# Patient Record
Sex: Female | Born: 1995 | Race: Asian | Hispanic: No | Marital: Married | Smoking: Never smoker
Health system: Southern US, Community
[De-identification: ages and names within clinical notes are randomized; demographics above are authoritative.]

## PROBLEM LIST (undated history)

## (undated) ENCOUNTER — Ambulatory Visit

## (undated) ENCOUNTER — Ambulatory Visit (HOSPITAL_COMMUNITY): Admission: EM | Payer: Self-pay | Source: Home / Self Care

## (undated) DIAGNOSIS — Z789 Other specified health status: Secondary | ICD-10-CM

## (undated) HISTORY — PX: NO PAST SURGERIES: SHX2092

---

## 2016-12-07 NOTE — L&D Delivery Note (Signed)
Delivery Note Patient pushed for 30 minutes.  At 8:43 PM a viable female was delivered via  (Presentation: OA ). There was a tight nuchal cord x 3 that was unable to be reduced prior to delivery.  APGAR: 8,9; weight pending .   Placenta status:spontaneous , in tact .  Cord: 3V with the following complications: None.  Cord pH: n/a  Anesthesia:  Epidural Episiotomy:  None Lacerations:  Hemostatic left labial laceration Suture Repair: n/a Est. Blood Loss (mL):  100 mL  Mom to postpartum.  Baby to Couplet care / Skin to Skin.  Kelsey Hale Rumaldo Difatta 12/02/2017, 9:02 PM

## 2017-06-28 LAB — OB RESULTS CONSOLE RUBELLA ANTIBODY, IGM: RUBELLA: IMMUNE

## 2017-06-28 LAB — OB RESULTS CONSOLE GC/CHLAMYDIA
Chlamydia: NEGATIVE
Gonorrhea: NEGATIVE

## 2017-06-28 LAB — OB RESULTS CONSOLE RPR: RPR: NONREACTIVE

## 2017-06-28 LAB — OB RESULTS CONSOLE HIV ANTIBODY (ROUTINE TESTING): HIV: NONREACTIVE

## 2017-06-28 LAB — OB RESULTS CONSOLE HEPATITIS B SURFACE ANTIGEN: Hepatitis B Surface Ag: NEGATIVE

## 2017-06-30 DIAGNOSIS — Z3490 Encounter for supervision of normal pregnancy, unspecified, unspecified trimester: Secondary | ICD-10-CM

## 2017-08-13 ENCOUNTER — Other Ambulatory Visit (HOSPITAL_COMMUNITY): Payer: Self-pay | Admitting: Obstetrics

## 2017-08-13 DIAGNOSIS — Z3689 Encounter for other specified antenatal screening: Secondary | ICD-10-CM

## 2017-08-20 ENCOUNTER — Encounter (HOSPITAL_COMMUNITY): Payer: Self-pay | Admitting: *Deleted

## 2017-08-24 ENCOUNTER — Other Ambulatory Visit (HOSPITAL_COMMUNITY): Payer: Self-pay | Admitting: Obstetrics

## 2017-08-24 ENCOUNTER — Encounter (HOSPITAL_COMMUNITY): Payer: Self-pay

## 2017-08-24 ENCOUNTER — Ambulatory Visit (HOSPITAL_COMMUNITY)
Admission: RE | Admit: 2017-08-24 | Discharge: 2017-08-24 | Disposition: A | Discharge status: Home / Self Care | Payer: Medicaid Other | Source: Ambulatory Visit | Attending: Obstetrics | Admitting: Obstetrics

## 2017-08-24 ENCOUNTER — Ambulatory Visit (HOSPITAL_COMMUNITY): Admission: RE | Admit: 2017-08-24 | Payer: Medicaid Other | Source: Ambulatory Visit

## 2017-08-24 ENCOUNTER — Other Ambulatory Visit (HOSPITAL_COMMUNITY): Payer: Self-pay | Admitting: *Deleted

## 2017-08-24 DIAGNOSIS — O289 Unspecified abnormal findings on antenatal screening of mother: Secondary | ICD-10-CM | POA: Insufficient documentation

## 2017-08-24 DIAGNOSIS — Z3A23 23 weeks gestation of pregnancy: Secondary | ICD-10-CM

## 2017-08-24 DIAGNOSIS — Z3689 Encounter for other specified antenatal screening: Secondary | ICD-10-CM | POA: Diagnosis present

## 2017-08-24 DIAGNOSIS — O365920 Maternal care for other known or suspected poor fetal growth, second trimester, not applicable or unspecified: Secondary | ICD-10-CM | POA: Diagnosis not present

## 2017-08-24 DIAGNOSIS — O359XX0 Maternal care for (suspected) fetal abnormality and damage, unspecified, not applicable or unspecified: Secondary | ICD-10-CM

## 2017-08-24 DIAGNOSIS — O365990 Maternal care for other known or suspected poor fetal growth, unspecified trimester, not applicable or unspecified: Secondary | ICD-10-CM

## 2017-08-24 HISTORY — DX: Other specified health status: Z78.9

## 2017-08-27 ENCOUNTER — Other Ambulatory Visit (HOSPITAL_COMMUNITY): Payer: Self-pay

## 2017-08-27 ENCOUNTER — Encounter (HOSPITAL_COMMUNITY): Payer: Self-pay

## 2017-09-14 ENCOUNTER — Other Ambulatory Visit (HOSPITAL_COMMUNITY): Payer: Self-pay | Admitting: Obstetrics and Gynecology

## 2017-09-14 ENCOUNTER — Ambulatory Visit (HOSPITAL_COMMUNITY)
Admission: RE | Admit: 2017-09-14 | Discharge: 2017-09-14 | Disposition: A | Discharge status: Home / Self Care | Payer: Medicaid Other | Source: Ambulatory Visit | Attending: Obstetrics | Admitting: Obstetrics

## 2017-09-14 ENCOUNTER — Encounter (HOSPITAL_COMMUNITY): Payer: Self-pay

## 2017-09-14 DIAGNOSIS — O365920 Maternal care for other known or suspected poor fetal growth, second trimester, not applicable or unspecified: Secondary | ICD-10-CM | POA: Insufficient documentation

## 2017-09-14 DIAGNOSIS — Z362 Encounter for other antenatal screening follow-up: Secondary | ICD-10-CM | POA: Insufficient documentation

## 2017-09-14 DIAGNOSIS — Z3A26 26 weeks gestation of pregnancy: Secondary | ICD-10-CM | POA: Insufficient documentation

## 2017-09-14 DIAGNOSIS — O365990 Maternal care for other known or suspected poor fetal growth, unspecified trimester, not applicable or unspecified: Secondary | ICD-10-CM

## 2017-09-15 ENCOUNTER — Other Ambulatory Visit (HOSPITAL_COMMUNITY): Payer: Self-pay | Admitting: *Deleted

## 2017-09-15 DIAGNOSIS — O365990 Maternal care for other known or suspected poor fetal growth, unspecified trimester, not applicable or unspecified: Secondary | ICD-10-CM

## 2017-09-17 ENCOUNTER — Other Ambulatory Visit: Payer: Self-pay

## 2017-09-21 ENCOUNTER — Ambulatory Visit (HOSPITAL_COMMUNITY)
Admission: RE | Admit: 2017-09-21 | Discharge: 2017-09-21 | Disposition: A | Discharge status: Home / Self Care | Payer: Medicaid Other | Source: Ambulatory Visit | Attending: Obstetrics | Admitting: Obstetrics

## 2017-09-21 ENCOUNTER — Other Ambulatory Visit (HOSPITAL_COMMUNITY): Payer: Self-pay | Admitting: Maternal and Fetal Medicine

## 2017-09-21 ENCOUNTER — Encounter (HOSPITAL_COMMUNITY): Payer: Self-pay

## 2017-09-21 DIAGNOSIS — O365990 Maternal care for other known or suspected poor fetal growth, unspecified trimester, not applicable or unspecified: Secondary | ICD-10-CM

## 2017-09-21 DIAGNOSIS — Z3A27 27 weeks gestation of pregnancy: Secondary | ICD-10-CM

## 2017-09-21 DIAGNOSIS — O365920 Maternal care for other known or suspected poor fetal growth, second trimester, not applicable or unspecified: Secondary | ICD-10-CM | POA: Diagnosis not present

## 2017-09-28 ENCOUNTER — Ambulatory Visit (HOSPITAL_COMMUNITY)
Admission: RE | Admit: 2017-09-28 | Discharge: 2017-09-28 | Disposition: A | Discharge status: Home / Self Care | Payer: Medicaid Other | Source: Ambulatory Visit | Attending: Obstetrics | Admitting: Obstetrics

## 2017-09-28 ENCOUNTER — Encounter (HOSPITAL_COMMUNITY): Payer: Self-pay

## 2017-09-28 DIAGNOSIS — O365920 Maternal care for other known or suspected poor fetal growth, second trimester, not applicable or unspecified: Secondary | ICD-10-CM | POA: Insufficient documentation

## 2017-09-28 DIAGNOSIS — O365990 Maternal care for other known or suspected poor fetal growth, unspecified trimester, not applicable or unspecified: Secondary | ICD-10-CM

## 2017-09-28 DIAGNOSIS — Z3A28 28 weeks gestation of pregnancy: Secondary | ICD-10-CM | POA: Diagnosis not present

## 2017-09-28 NOTE — Procedures (Signed)
Kelsey Hale Mar 25, 1996 [id number]  Fetus A Non-Stress Test Interpretation for 09/28/17  Indication: Unsatisfactory BPP  Fetal Heart Rate A Mode: External Baseline Rate (A): 135 bpm Variability: Moderate Accelerations: 10 x 10 Decelerations: Variable Multiple birth?: No  Uterine Activity Mode: Palpation, Toco Contraction Frequency (min): x1 Contraction Duration (sec): 50 Contraction Quality: Mild (Pt denies feeling ) Resting Tone Palpated: Relaxed Resting Time: Adequate  Interpretation (Fetal Testing) Nonstress Test Interpretation: Reactive Overall Impression: Reassuring for gestational age Comments: Reviewed tracing with Dr. Otho PerlNitsche

## 2017-10-05 ENCOUNTER — Ambulatory Visit (HOSPITAL_COMMUNITY): Admission: RE | Admit: 2017-10-05 | Payer: Medicaid Other | Source: Ambulatory Visit

## 2017-10-13 ENCOUNTER — Encounter (HOSPITAL_COMMUNITY): Payer: Self-pay

## 2017-10-13 ENCOUNTER — Ambulatory Visit (HOSPITAL_COMMUNITY)
Admission: RE | Admit: 2017-10-13 | Discharge: 2017-10-13 | Disposition: A | Discharge status: Home / Self Care | Payer: Medicaid Other | Source: Ambulatory Visit | Attending: Maternal and Fetal Medicine | Admitting: Maternal and Fetal Medicine

## 2017-10-13 ENCOUNTER — Other Ambulatory Visit (HOSPITAL_COMMUNITY): Payer: Self-pay | Admitting: Maternal and Fetal Medicine

## 2017-10-13 DIAGNOSIS — Z3A30 30 weeks gestation of pregnancy: Secondary | ICD-10-CM

## 2017-10-13 DIAGNOSIS — O365930 Maternal care for other known or suspected poor fetal growth, third trimester, not applicable or unspecified: Secondary | ICD-10-CM | POA: Diagnosis present

## 2017-10-13 DIAGNOSIS — O365990 Maternal care for other known or suspected poor fetal growth, unspecified trimester, not applicable or unspecified: Secondary | ICD-10-CM

## 2017-10-14 ENCOUNTER — Other Ambulatory Visit (HOSPITAL_COMMUNITY): Payer: Self-pay | Admitting: *Deleted

## 2017-10-14 DIAGNOSIS — O26643 Intrahepatic cholestasis of pregnancy, third trimester: Secondary | ICD-10-CM

## 2017-10-14 DIAGNOSIS — O26613 Liver and biliary tract disorders in pregnancy, third trimester: Principal | ICD-10-CM

## 2017-10-14 DIAGNOSIS — K831 Obstruction of bile duct: Secondary | ICD-10-CM

## 2017-11-10 ENCOUNTER — Encounter (HOSPITAL_COMMUNITY): Payer: Self-pay

## 2017-11-10 ENCOUNTER — Ambulatory Visit (HOSPITAL_COMMUNITY)
Admission: RE | Admit: 2017-11-10 | Discharge: 2017-11-10 | Disposition: A | Discharge status: Home / Self Care | Payer: Medicaid Other | Source: Ambulatory Visit | Attending: Obstetrics | Admitting: Obstetrics

## 2017-11-19 LAB — OB RESULTS CONSOLE GC/CHLAMYDIA
Chlamydia: NEGATIVE
Gonorrhea: NEGATIVE

## 2017-11-21 LAB — OB RESULTS CONSOLE GBS: GBS: NEGATIVE

## 2017-11-23 ENCOUNTER — Telehealth (HOSPITAL_COMMUNITY): Payer: Self-pay | Admitting: *Deleted

## 2017-11-24 ENCOUNTER — Inpatient Hospital Stay (HOSPITAL_COMMUNITY)
Admission: RE | Admit: 2017-11-24 | Discharge: 2017-11-24 | Disposition: A | Discharge status: Home / Self Care | Payer: Medicaid Other | Source: Ambulatory Visit | Attending: Maternal and Fetal Medicine | Admitting: Maternal and Fetal Medicine

## 2017-11-25 ENCOUNTER — Encounter (HOSPITAL_COMMUNITY): Payer: Self-pay

## 2017-11-25 ENCOUNTER — Other Ambulatory Visit (HOSPITAL_COMMUNITY): Payer: Self-pay | Admitting: Maternal and Fetal Medicine

## 2017-11-25 ENCOUNTER — Ambulatory Visit (HOSPITAL_COMMUNITY)
Admission: RE | Admit: 2017-11-25 | Discharge: 2017-11-25 | Disposition: A | Discharge status: Home / Self Care | Payer: Medicaid Other | Source: Ambulatory Visit | Attending: Obstetrics | Admitting: Obstetrics

## 2017-11-25 DIAGNOSIS — O26613 Liver and biliary tract disorders in pregnancy, third trimester: Principal | ICD-10-CM

## 2017-11-25 DIAGNOSIS — O365930 Maternal care for other known or suspected poor fetal growth, third trimester, not applicable or unspecified: Secondary | ICD-10-CM | POA: Insufficient documentation

## 2017-11-25 DIAGNOSIS — Z3A36 36 weeks gestation of pregnancy: Secondary | ICD-10-CM | POA: Insufficient documentation

## 2017-11-25 DIAGNOSIS — K8020 Calculus of gallbladder without cholecystitis without obstruction: Secondary | ICD-10-CM | POA: Diagnosis not present

## 2017-11-25 DIAGNOSIS — K831 Obstruction of bile duct: Secondary | ICD-10-CM

## 2017-12-01 ENCOUNTER — Other Ambulatory Visit: Payer: Self-pay | Admitting: Obstetrics and Gynecology

## 2017-12-02 ENCOUNTER — Inpatient Hospital Stay (HOSPITAL_COMMUNITY)
Admission: RE | Admit: 2017-12-02 | Discharge: 2017-12-04 | DRG: 805 | Disposition: A | Discharge status: Home / Self Care | Payer: Medicaid Other | Source: Ambulatory Visit | Attending: Obstetrics | Admitting: Obstetrics

## 2017-12-02 ENCOUNTER — Encounter (HOSPITAL_COMMUNITY): Payer: Self-pay

## 2017-12-02 ENCOUNTER — Inpatient Hospital Stay (HOSPITAL_COMMUNITY): Payer: Medicaid Other | Admitting: Anesthesiology

## 2017-12-02 DIAGNOSIS — O2662 Liver and biliary tract disorders in childbirth: Secondary | ICD-10-CM | POA: Diagnosis present

## 2017-12-02 DIAGNOSIS — K831 Obstruction of bile duct: Secondary | ICD-10-CM | POA: Diagnosis present

## 2017-12-02 DIAGNOSIS — Z3A37 37 weeks gestation of pregnancy: Secondary | ICD-10-CM | POA: Diagnosis not present

## 2017-12-02 DIAGNOSIS — O365930 Maternal care for other known or suspected poor fetal growth, third trimester, not applicable or unspecified: Secondary | ICD-10-CM | POA: Diagnosis present

## 2017-12-02 DIAGNOSIS — Z3490 Encounter for supervision of normal pregnancy, unspecified, unspecified trimester: Secondary | ICD-10-CM

## 2017-12-02 LAB — ABO/RH: ABO/RH(D): A POS

## 2017-12-02 LAB — CBC
HCT: 39.6 % (ref 36.0–46.0)
Hemoglobin: 13.1 g/dL (ref 12.0–15.0)
MCH: 29.5 pg (ref 26.0–34.0)
MCHC: 33.1 g/dL (ref 30.0–36.0)
MCV: 89.2 fL (ref 78.0–100.0)
PLATELETS: 185 10*3/uL (ref 150–400)
RBC: 4.44 MIL/uL (ref 3.87–5.11)
RDW: 13.2 % (ref 11.5–15.5)
WBC: 14.2 10*3/uL — AB (ref 4.0–10.5)

## 2017-12-02 LAB — RPR: RPR: NONREACTIVE

## 2017-12-02 LAB — COMPREHENSIVE METABOLIC PANEL
ALBUMIN: 2.7 g/dL — AB (ref 3.5–5.0)
ALK PHOS: 159 U/L — AB (ref 38–126)
ALT: 31 U/L (ref 14–54)
AST: 26 U/L (ref 15–41)
Anion gap: 10 (ref 5–15)
BILIRUBIN TOTAL: 0.7 mg/dL (ref 0.3–1.2)
BUN: 11 mg/dL (ref 6–20)
CALCIUM: 8.7 mg/dL — AB (ref 8.9–10.3)
CO2: 20 mmol/L — AB (ref 22–32)
CREATININE: 0.67 mg/dL (ref 0.44–1.00)
Chloride: 104 mmol/L (ref 101–111)
GFR calc Af Amer: >60 mL/min (ref >60)
GFR calc non Af Amer: >60 mL/min (ref >60)
GLUCOSE: 111 mg/dL — AB (ref 65–99)
Potassium: 3.6 mmol/L (ref 3.5–5.1)
SODIUM: 134 mmol/L — AB (ref 135–145)
TOTAL PROTEIN: 6.2 g/dL — AB (ref 6.5–8.1)

## 2017-12-02 LAB — TYPE AND SCREEN
ABO/RH(D): A POS
ANTIBODY SCREEN: NEGATIVE

## 2017-12-02 LAB — OB RESULTS CONSOLE ABO/RH: RH TYPE: POSITIVE

## 2017-12-02 MED ORDER — OXYTOCIN 40 UNITS IN LACTATED RINGERS INFUSION - SIMPLE MED
1.0000 m[IU]/min | INTRAVENOUS | Status: DC
  Administered 2017-12-02: 2 m[IU]/min via INTRAVENOUS

## 2017-12-02 MED ORDER — DIPHENHYDRAMINE HCL 50 MG/ML IJ SOLN: 12.5000 mg | INTRAMUSCULAR | Status: DC | PRN

## 2017-12-02 MED ORDER — SENNOSIDES-DOCUSATE SODIUM 8.6-50 MG PO TABS
2.0000 | ORAL_TABLET | ORAL | Status: DC
  Administered 2017-12-03 (×2): 2 via ORAL
  Filled 2017-12-02 (×2): qty 2

## 2017-12-02 MED ORDER — ACETAMINOPHEN 325 MG PO TABS: 650.0000 mg | ORAL_TABLET | ORAL | Status: DC | PRN

## 2017-12-02 MED ORDER — EPHEDRINE 5 MG/ML INJ
10.0000 mg | INTRAVENOUS | Status: DC | PRN
  Filled 2017-12-02: qty 2

## 2017-12-02 MED ORDER — PHENYLEPHRINE 40 MCG/ML (10ML) SYRINGE FOR IV PUSH (FOR BLOOD PRESSURE SUPPORT)
80.0000 ug | PREFILLED_SYRINGE | INTRAVENOUS | Status: DC | PRN
  Filled 2017-12-02: qty 5

## 2017-12-02 MED ORDER — COCONUT OIL OIL: 1.0000 "application " | TOPICAL_OIL | Status: DC | PRN

## 2017-12-02 MED ORDER — SOD CITRATE-CITRIC ACID 500-334 MG/5ML PO SOLN: 30.0000 mL | ORAL | Status: DC | PRN

## 2017-12-02 MED ORDER — TETANUS-DIPHTH-ACELL PERTUSSIS 5-2.5-18.5 LF-MCG/0.5 IM SUSP: 0.5000 mL | Freq: Once | INTRAMUSCULAR | Status: DC

## 2017-12-02 MED ORDER — TERBUTALINE SULFATE 1 MG/ML IJ SOLN
0.2500 mg | Freq: Once | INTRAMUSCULAR | Status: AC | PRN
  Administered 2017-12-02: 0.25 mg via SUBCUTANEOUS

## 2017-12-02 MED ORDER — PRENATAL MULTIVITAMIN CH
1.0000 | ORAL_TABLET | Freq: Every day | ORAL | Status: DC
  Administered 2017-12-03 – 2017-12-04 (×2): 1 via ORAL
  Filled 2017-12-02 (×2): qty 1

## 2017-12-02 MED ORDER — LACTATED RINGERS IV SOLN: INTRAVENOUS | Status: DC

## 2017-12-02 MED ORDER — ONDANSETRON HCL 4 MG/2ML IJ SOLN: 4.0000 mg | Freq: Four times a day (QID) | INTRAMUSCULAR | Status: DC | PRN

## 2017-12-02 MED ORDER — OXYCODONE HCL 5 MG PO TABS: 5.0000 mg | ORAL_TABLET | ORAL | Status: DC | PRN

## 2017-12-02 MED ORDER — OXYCODONE HCL 5 MG PO TABS: 10.0000 mg | ORAL_TABLET | ORAL | Status: DC | PRN

## 2017-12-02 MED ORDER — OXYTOCIN 40 UNITS IN LACTATED RINGERS INFUSION - SIMPLE MED
2.5000 [IU]/h | INTRAVENOUS | Status: DC
  Administered 2017-12-02: 2.5 [IU]/h via INTRAVENOUS
  Filled 2017-12-02: qty 1000

## 2017-12-02 MED ORDER — IBUPROFEN 600 MG PO TABS
600.0000 mg | ORAL_TABLET | Freq: Four times a day (QID) | ORAL | Status: DC
  Administered 2017-12-03 – 2017-12-04 (×7): 600 mg via ORAL
  Filled 2017-12-02 (×7): qty 1

## 2017-12-02 MED ORDER — TERBUTALINE SULFATE 1 MG/ML IJ SOLN
0.2500 mg | Freq: Once | INTRAMUSCULAR | Status: DC | PRN
  Filled 2017-12-02: qty 1

## 2017-12-02 MED ORDER — SIMETHICONE 80 MG PO CHEW: 80.0000 mg | CHEWABLE_TABLET | ORAL | Status: DC | PRN

## 2017-12-02 MED ORDER — LIDOCAINE HCL (PF) 1 % IJ SOLN
30.0000 mL | INTRAMUSCULAR | Status: DC | PRN
  Filled 2017-12-02: qty 30

## 2017-12-02 MED ORDER — LACTATED RINGERS IV SOLN
500.0000 mL | INTRAVENOUS | Status: DC | PRN
  Administered 2017-12-02 (×2): 500 mL via INTRAVENOUS

## 2017-12-02 MED ORDER — DIBUCAINE 1 % RE OINT: 1.0000 "application " | TOPICAL_OINTMENT | RECTAL | Status: DC | PRN

## 2017-12-02 MED ORDER — BENZOCAINE-MENTHOL 20-0.5 % EX AERO
1.0000 | INHALATION_SPRAY | CUTANEOUS | Status: DC | PRN
Start: 2017-12-02 — End: 2017-12-04

## 2017-12-02 MED ORDER — OXYCODONE-ACETAMINOPHEN 5-325 MG PO TABS: 2.0000 | ORAL_TABLET | ORAL | Status: DC | PRN

## 2017-12-02 MED ORDER — OXYTOCIN BOLUS FROM INFUSION
500.0000 mL | Freq: Once | INTRAVENOUS | Status: DC
  Administered 2017-12-02: 500 mL via INTRAVENOUS

## 2017-12-02 MED ORDER — WITCH HAZEL-GLYCERIN EX PADS: 1.0000 "application " | MEDICATED_PAD | CUTANEOUS | Status: DC | PRN

## 2017-12-02 MED ORDER — FENTANYL 2.5 MCG/ML BUPIVACAINE 1/10 % EPIDURAL INFUSION (WH - ANES)
INTRAMUSCULAR | Status: AC
  Filled 2017-12-02: qty 100

## 2017-12-02 MED ORDER — ONDANSETRON HCL 4 MG PO TABS: 4.0000 mg | ORAL_TABLET | ORAL | Status: DC | PRN

## 2017-12-02 MED ORDER — PHENYLEPHRINE 40 MCG/ML (10ML) SYRINGE FOR IV PUSH (FOR BLOOD PRESSURE SUPPORT)
PREFILLED_SYRINGE | INTRAVENOUS | Status: AC
  Filled 2017-12-02: qty 10

## 2017-12-02 MED ORDER — DIPHENHYDRAMINE HCL 25 MG PO CAPS: 25.0000 mg | ORAL_CAPSULE | Freq: Four times a day (QID) | ORAL | Status: DC | PRN

## 2017-12-02 MED ORDER — LIDOCAINE HCL (PF) 1 % IJ SOLN
INTRAMUSCULAR | Status: DC | PRN
  Administered 2017-12-02: 4 mL
  Administered 2017-12-02: 6 mL via EPIDURAL

## 2017-12-02 MED ORDER — LACTATED RINGERS IV SOLN: 500.0000 mL | Freq: Once | INTRAVENOUS | Status: DC

## 2017-12-02 MED ORDER — BUTORPHANOL TARTRATE 1 MG/ML IJ SOLN: 1.0000 mg | INTRAMUSCULAR | Status: DC | PRN

## 2017-12-02 MED ORDER — FENTANYL 2.5 MCG/ML BUPIVACAINE 1/10 % EPIDURAL INFUSION (WH - ANES)
14.0000 mL/h | INTRAMUSCULAR | Status: DC | PRN
  Administered 2017-12-02: 14 mL/h via EPIDURAL

## 2017-12-02 MED ORDER — MISOPROSTOL 25 MCG QUARTER TABLET
25.0000 ug | ORAL_TABLET | ORAL | Status: DC | PRN
  Administered 2017-12-02: 25 ug via VAGINAL
  Filled 2017-12-02 (×2): qty 1

## 2017-12-02 MED ORDER — OXYCODONE-ACETAMINOPHEN 5-325 MG PO TABS: 1.0000 | ORAL_TABLET | ORAL | Status: DC | PRN

## 2017-12-02 MED ORDER — ONDANSETRON HCL 4 MG/2ML IJ SOLN: 4.0000 mg | INTRAMUSCULAR | Status: DC | PRN

## 2017-12-02 NOTE — Anesthesia Preprocedure Evaluation (Signed)

## 2017-12-02 NOTE — Anesthesia Pain Management Evaluation Note (Signed)
  CRNA Pain Management Visit Note  Patient: Kelsey Hale, 21 y.o., female  "Hello I am a member of the anesthesia team at [facility]. We have an anesthesia team available at all times to provide care throughout the hospital, including epidural management and anesthesia for C-section. I don't know your plan for the delivery whether it a natural birth, water birth, IV sedation, nitrous supplementation, doula or epidural, but we want to meet your pain goals."   1.Was your pain managed to your expectations on prior hospitalizations?   No prior hospitalizations  2.What is your expectation for pain management during this hospitalization?     Labor support without medications  3.How can we help you reach that goal? Be available if needed  Record the patient's initial score and the patient's pain goal.   Pain: 0  Pain Goal: 8 [facility] wants you to be able to say your pain was always managed very well.  Benny Henrie 12/02/2017

## 2017-12-02 NOTE — Progress Notes (Signed)
Called to bedside for spontaneous deep variable decels to 60s x 15 minutes.  Pitocin was stopped. O2 had been applied by nursing. Patient noted to be contracting q1-2 minutes.  Terbutaline 0.25 mg [place] was given x 1.  On exam--5/50/-2, AROM moderate clear fluid, IUPC was placed for possible amnioinfusion.   Following ROM-- the decelerations spontaneously resolved with baseline 130, moderate variability and accelerations noted within 10 minutes of last decelerations.    Patient has been monitored for an additional 90 minutes with reassuring fetal status and no additional decelerations Toco: q3 to 6 minutes  At this time, will restart pitocin at 2mU.  Discussed previously with patient and family potential indications for cesarean delivery.

## 2017-12-02 NOTE — Anesthesia Procedure Notes (Signed)
Epidural Patient location during procedure: OB  Staffing Anesthesiologist: Beauty Pless, MD  Preanesthetic Checklist Completed: patient identified, pre-op evaluation, timeout performed, IV checked, risks and benefits discussed and monitors and equipment checked  Epidural Patient position: sitting Prep: DuraPrep Patient monitoring: blood pressure and continuous pulse ox Approach: right paramedian Location: L3-L4 Injection technique: LOR air  Needle:  Needle type: Tuohy  Needle gauge: 17 G Needle insertion depth: 6 cm Catheter type: closed end flexible Catheter size: 19 Gauge Catheter at skin depth: 12 cm Test dose: negative  Assessment Sensory level: T8  Additional Notes   Dosing of Epidural:  1st dose, through catheter .............................................  Xylocaine 40 mg  2nd dose, through catheter, after waiting 3 minutes.........Xylocaine 60 mg    As each dose occurred, patient was free of IV sx; and patient exhibited no evidence of SA injection.  Patient is more comfortable after epidural dosed. Please see RN's note for documentation of vital signs,and FHR which are stable.  Patient reminded not to try to ambulate with numb legs, and that an RN must be present when she attempts to get up.          

## 2017-12-02 NOTE — H&P (Signed)
21 y.o. G1P0 @ 348w3d presents for IOL for cholestasis of pregnancy.  Otherwise has good fetal movement and no bleeding.  She received 1 dose of cytotec overnight.  She had 2 noted decelerations of 2-3 minutes with a nadir in the 60s with spontaneous return to baseline.  In between, fetal monitoring is cat 1 with frequent accelerations.  At this time, she reports mild cramping  Pregnancy c/b:  1. Cholestasis of pregnancy: bile acids 13.2 on 10/25.  On ursodiol 2. Small for gestational age: [facility]  Most recent scan on 12/21--5lb 12 oz (12% with AC at 9%).  UAD have been normal  Past Medical History:  Diagnosis Date  . Medical history non-contributory     Past Surgical History:  Procedure Laterality Date  . NO PAST SURGERIES      OB History  Gravida Para Term Preterm AB Living  1         0  SAB TAB Ectopic Multiple Live Births               # Outcome Date GA Lbr Len/2nd Weight Sex Delivery Anes PTL Lv  1 Current               Social History   Socioeconomic History  . Marital status: Married    Spouse name: Not on file  . Number of children: Not on file  . Years of education: Not on file  . Highest education level: Not on file  Tobacco Use  . Smoking status: Never Smoker  . Smokeless tobacco: Never Used  Substance and Sexual Activity  . Alcohol use: No  . Drug use: No  . Sexual activity: Yes    Birth control/protection: None  Other Topics Concern  . Not on file  Social History Narrative  . Not on file   Patient has no known allergies.    Prenatal Transfer Tool  Maternal Diabetes: No Genetic Screening: Normal Maternal Ultrasounds/Referrals: Abnormal:  Findings:   Other: SGA, initial referral for possible VSD but MFM scan NORMAL heart anatomy Fetal Ultrasounds or other Referrals:  Referred to Materal Fetal Medicine  see above Maternal Substance Abuse:  No Significant Maternal Medications:  Meds include: Other: ursodiol Significant  Maternal Lab Results: Lab values include: Group B Strep negative  ABO, Rh: --/--/A POS, A POS (12/27 0125) Antibody: NEG (12/27 0125) Rubella:  Immune RPR:   NR HBsAg:   Neg HIV:  Neg  GBS: Negative (12/16 0000)      Vitals:   12/02/17 0710 12/02/17 0725  BP:  109/60  Pulse:  74  Resp: 16   Temp:  97.9 F (36.6 C)     General:  NAD Abdomen:  soft, gravid, EFW 5.5-6# Ex:  no edema SVE:  4/50/-2 FHTs:  130s, mod var, + accels Toco:  q2-3 min   A/P   21 y.o. G1P0 238w3d presents for IOL for cholestasis of pregnancy IOL--s/p cytotec x 1.  Will start pitocin Cholestasis--repeat CMP now Epidural upon request Fetal decelerations x 2 overnight.  Discussed with patient concern that with SGA infant, increased risk of placental insufficiency and therefore decreased fetal tolerance to labor.  In between deceleration, monitoring is reassuring.   FSR/ vtx/ GBS neg  Jenan Ellegood GEFFEL Lenoir Facchini

## 2017-12-03 LAB — CBC
HEMATOCRIT: 38.1 % (ref 36.0–46.0)
HEMOGLOBIN: 12.9 g/dL (ref 12.0–15.0)
MCH: 30.1 pg (ref 26.0–34.0)
MCHC: 33.9 g/dL (ref 30.0–36.0)
MCV: 88.8 fL (ref 78.0–100.0)
Platelets: 185 10*3/uL (ref 150–400)
RBC: 4.29 MIL/uL (ref 3.87–5.11)
RDW: 13.3 % (ref 11.5–15.5)
WBC: 16.5 10*3/uL — ABNORMAL HIGH (ref 4.0–10.5)

## 2017-12-03 MED ORDER — OXYTOCIN 40 UNITS IN LACTATED RINGERS INFUSION - SIMPLE MED
INTRAVENOUS | Status: AC
  Filled 2017-12-03: qty 1000

## 2017-12-03 NOTE — Anesthesia Postprocedure Evaluation (Signed)
Anesthesia Post Note  Patient: Kelsey Hale  Procedure(s) Performed: AN AD HOC LABOR EPIDURAL     Patient location during evaluation: Mother Baby Anesthesia Type: Epidural Level of consciousness: awake and alert and oriented Pain management: satisfactory to patient Vital Signs Assessment: post-procedure vital signs reviewed and stable Respiratory status: spontaneous breathing and nonlabored ventilation Cardiovascular status: stable Postop Assessment: no headache, no backache, no signs of nausea or vomiting, adequate PO intake and patient able to bend at knees (patient up walking) Anesthetic complications: no    Last Vitals:  Vitals:   12/02/17 2350 12/03/17 0400  BP: 121/66 130/74  Pulse: 96 84  Resp: 18 18  Temp: 37.1 C 36.9 C  SpO2: 99% 100%    Last Pain:  Vitals:   12/03/17 0611  TempSrc:   PainSc: 0-No pain   Pain Goal:                 [name],[name]

## 2017-12-03 NOTE — Lactation Note (Signed)
This note was copied from a baby's chart. Lactation Consultation Note  Patient Name: Kelsey Hale Today's Date: 12/03/2017 Reason for consult: Initial assessment  17 hours old early term female who is being both breastfeed but mostly formula fed my her mother (P1). Encourage mom to breastfeed on cues and minimize formula consumption. Offered mom to latch baby on the breast but she wasn't interested. Discussed breastfeeding basics and engorgement. Lactation brochure and resources handouts were given to mom.  Interventions Interventions: Breast feeding basics reviewed;Pre-pump if needed;Reverse pressure;DEBP;Breast compression  Lactation Tools Discussed/Used Tools: Pump Breast pump type: Double-Electric Breast Pump   Consult Status Consult Status: Follow-up Date: 12/04/17 Follow-up type: In-patient    Leyland Kenna Venetia ConstableS Zailynn Brandel 12/03/2017, 1:51 PM

## 2017-12-03 NOTE — Progress Notes (Signed)
PPD#1 Pt is doing well  VSSAF IMP/ Pt is stable Plan/ Routine care.

## 2017-12-04 MED ORDER — IBUPROFEN 600 MG PO TABS: 600.0000 mg | ORAL_TABLET | Freq: Four times a day (QID) | ORAL | 0 refills | Status: AC

## 2017-12-04 NOTE — Lactation Note (Signed)
This note was copied from a baby's chart. Lactation Consultation Note Mom is formula feeding until her milk comes in d/t doesn't have enough. Explained to mom consistency of colostrum, importance, transitional milk, mature milk, filling, engorgement, pumping, milk storage, supply and demand. Mom isn't putting baby to breast. Encouraged to do so while here to let LC help before going home. Mom insist on just pumping and formula feeding at this time.  Reviewed amount of formula baby was getting wasn't enough if only formula feeding and not putting to breast before giving formula. Last feeding mom gave 15 ml formula. Stated baby didn't want any more. Explained to burp after 15 ml and then give 15-20 ml more. Gave feeding sheet for formula feeding babies and how much they should be getting. Mom has been using hand pump. LC set up DEBP for stimulation. Noted few drops of colostrum. Mom shown how to use DEBP & how to disassemble, clean, & reassemble parts. Mom knows to pump q3h for 15-20 min.  Mom has WIC. Mom was given Rush BarerGerber formula. LC would had given 22 cal Similac.  Stressed importance of I&O d/t small baby. Parents stated understanding. Parents wanting d/c home today. Parents is waking baby up every 3 hrs for feeding. Discussed looking for feeding cues as well. Stated they had been.  Reminded of OP LC services.  Encouraged mom to call for latching assistance if needed. Hand expressed colostrum to show mom she has colostrum.   Patient Name: Girl Rhett Bannistermna Nicklas ZOXWR'UToday's Date: 12/04/2017 Reason for consult: Follow-up assessment;Infant < 6lbs;Early term 37-38.6wks   Maternal Data    Feeding Feeding Type: Bottle Fed - Formula Nipple Type: Slow - flow Length of feed: 5 min  LATCH Score Latch: Too sleepy or reluctant, no latch achieved, no sucking elicited.  Audible Swallowing: None  Type of Nipple: Everted at rest and after stimulation  Comfort (Breast/Nipple): Soft / non-tender  Hold  (Positioning): No assistance needed to correctly position infant at breast.  LATCH Score: 6  Interventions Interventions: Breast feeding basics reviewed;Breast compression;Hand pump;DEBP;Breast massage;Hand express;Pre-pump if needed  Lactation Tools Discussed/Used Tools: Pump Breast pump type: Double-Electric Breast Pump Pump Review: Setup, frequency, and cleaning;Milk Storage Initiated by:: Peri JeffersonL. Carlton Buskey RN IBCLC Date initiated:: 12/04/17   Consult Status Consult Status: Complete Date: 12/04/17    Charyl DancerCARVER, Vernis Eid G 12/04/2017, 2:02 AM

## 2017-12-04 NOTE — Progress Notes (Signed)
PPD#2 Pt without complaints. Peds not letting baby go home. VSSAF IMP/ Stable Plan/ Routine care

## 2017-12-04 NOTE — Discharge Summary (Signed)
Obstetric Discharge Summary Reason for Admission: induction of labor Prenatal Procedures: NST and ultrasound Intrapartum Procedures: spontaneous vaginal delivery Postpartum Procedures: none Complications-Operative and Postpartum: labial laceration Hemoglobin  Date Value Ref Range Status  12/03/2017 12.9 12.0 - 15.0 g/dL Final   HCT  Date Value Ref Range Status  12/03/2017 38.1 36.0 - 46.0 % Final    Physical Exam:  General: alert and cooperative Lochia: appropriate Uterine Fundus: firm  Discharge Diagnoses: IUP and cholestasis of preg  Discharge Information: Date: 12/04/2017 Activity: pelvic rest Diet: routine Medications: PNV and Ibuprofen Condition: stable Instructions: refer to practice specific booklet Discharge to: home Follow-up Information    Marlow Baarslark, Dyanna, MD. Schedule an appointment as soon as possible for a visit in 1 month(s).   Specialty:  Obstetrics Contact information: [address] [place] [address] [phone]           Newborn Data: Live born female  Birth Weight: 5 lb 5.4 oz (2421 g) APGAR: 8, 9  Newborn Delivery   Birth date/time:  12/02/2017 20:43:00 Delivery type:  Vaginal, Spontaneous     Home with mother.  Levi AlandNDERSON,MARK E 12/04/2017, 4:47 PM

## 2017-12-04 NOTE — Progress Notes (Signed)
Called Dr Dareen PianoAnderson clarifying if patient will be discharged today. He states baby will not be discharged today and doesn't have access to a computer and may be in the hospital later for a delivery.

## 2018-05-20 NOTE — Telephone Encounter (Signed)
Preadmission screen  

## 2019-02-25 IMAGING — US US MFM OB FOLLOW-UP
1 series · 13 of 28 positions shown · non-contrast
Comparison: none

[Series 1: us mfm ob follow-up · 13 of 63 slices shown]
[im 3/63]
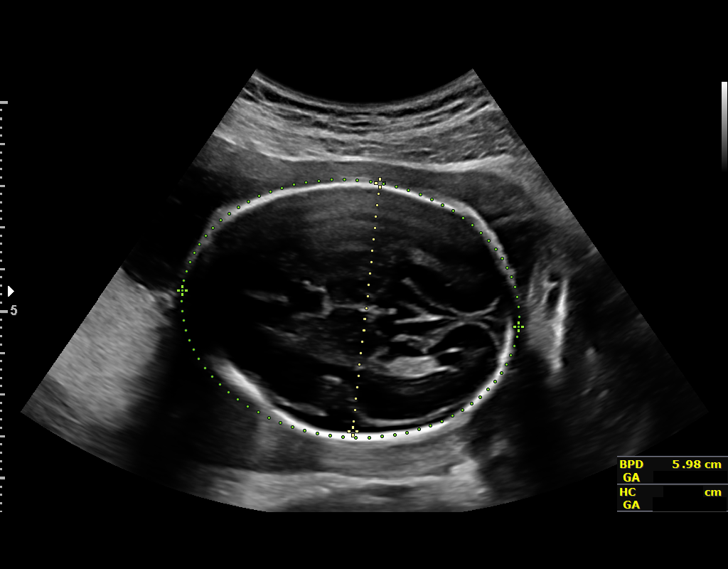
[im 7/63]
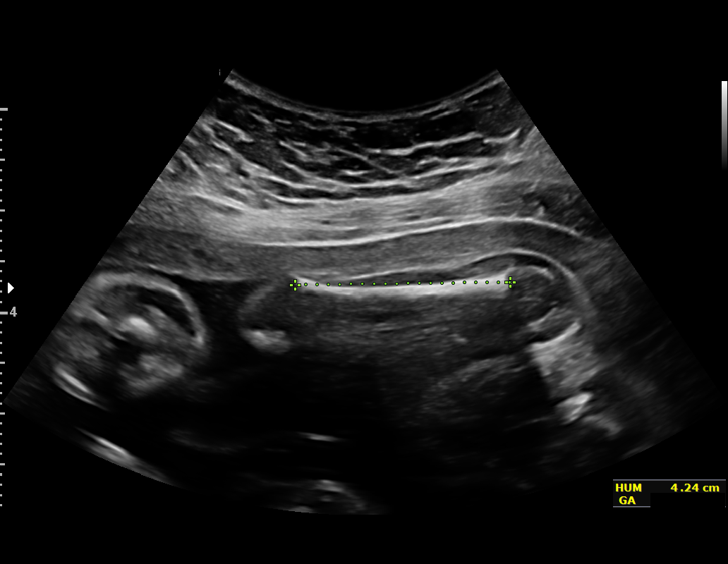
[im 12/63]
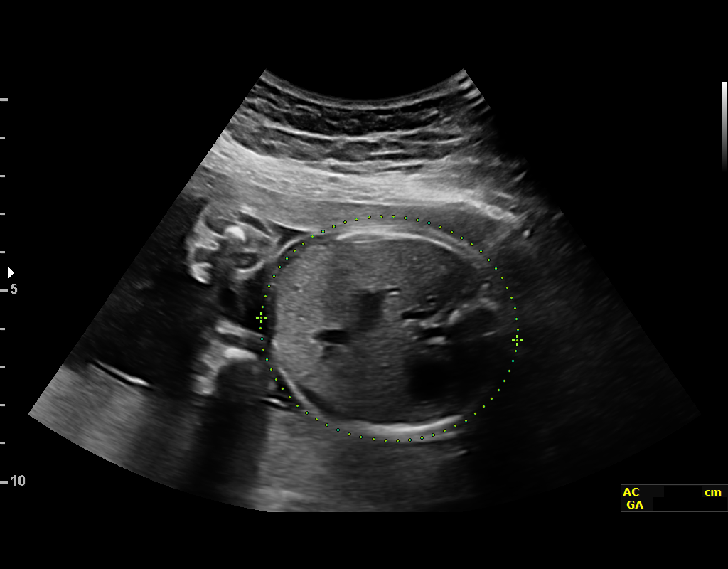
[im 17/63]
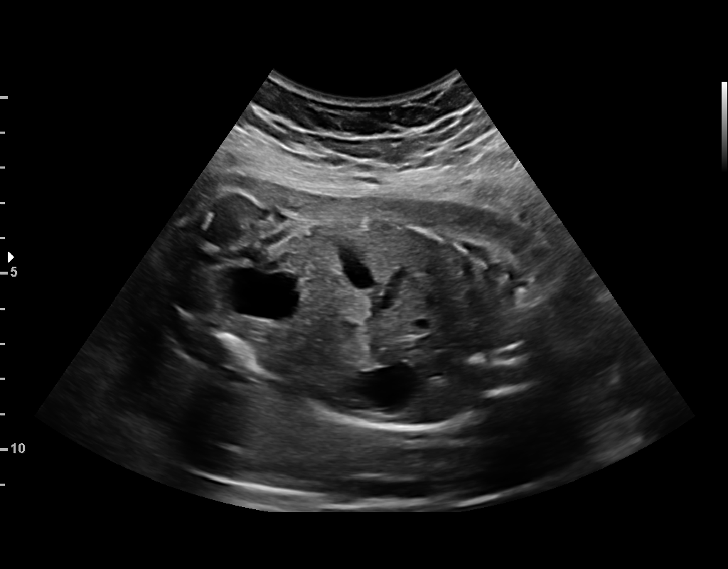
[im 21/63]
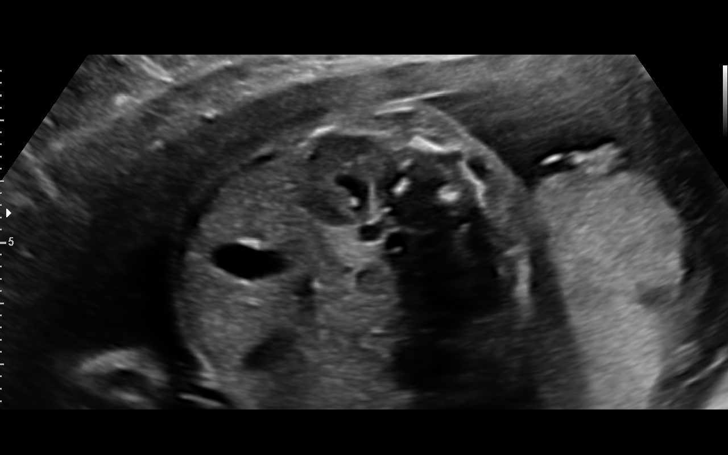
[im 26/63]
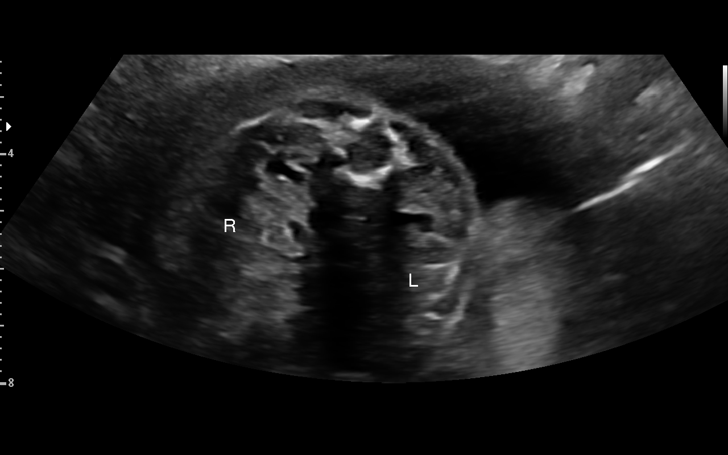
[im 33/63]
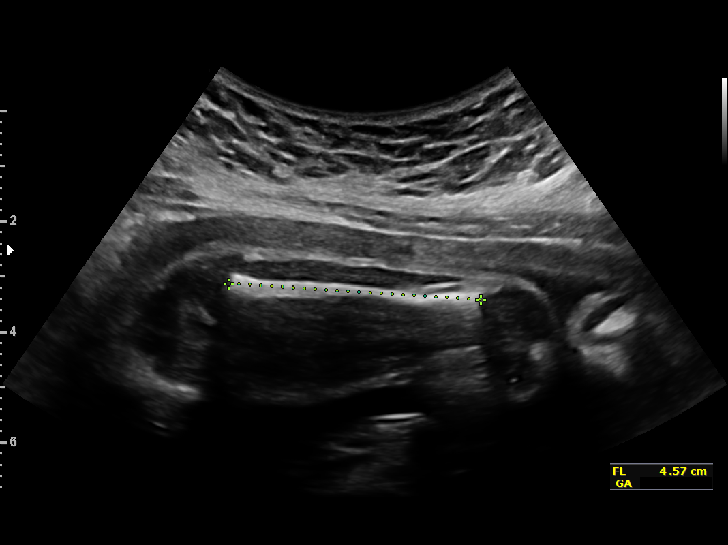
[im 37/63]
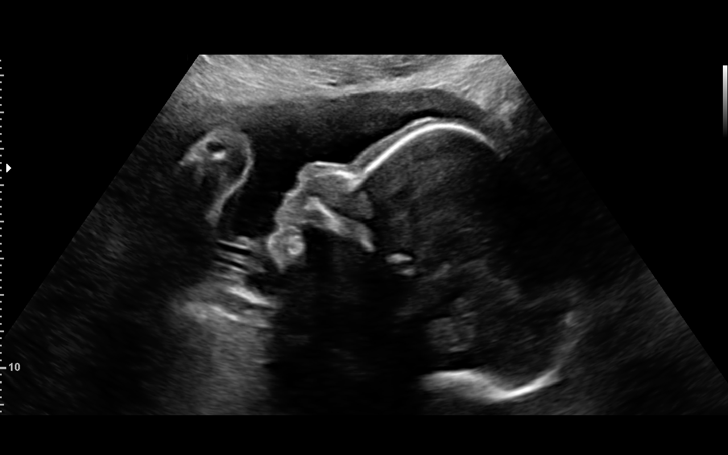
[im 42/63]
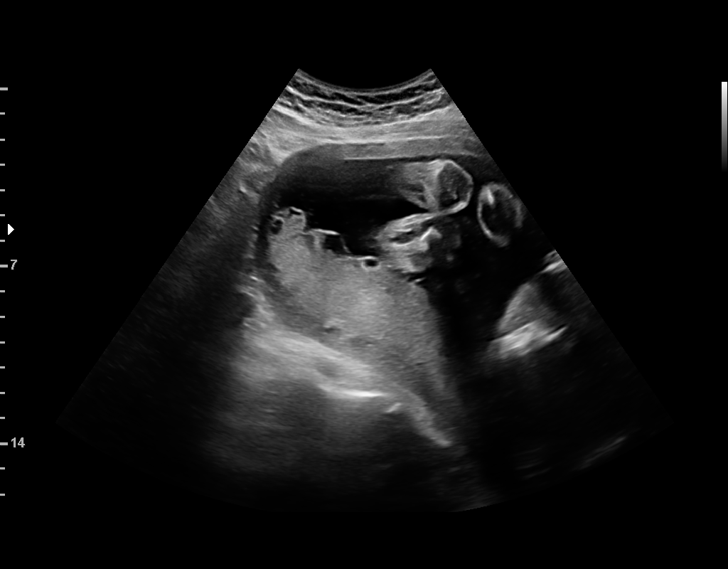
[im 46/63]
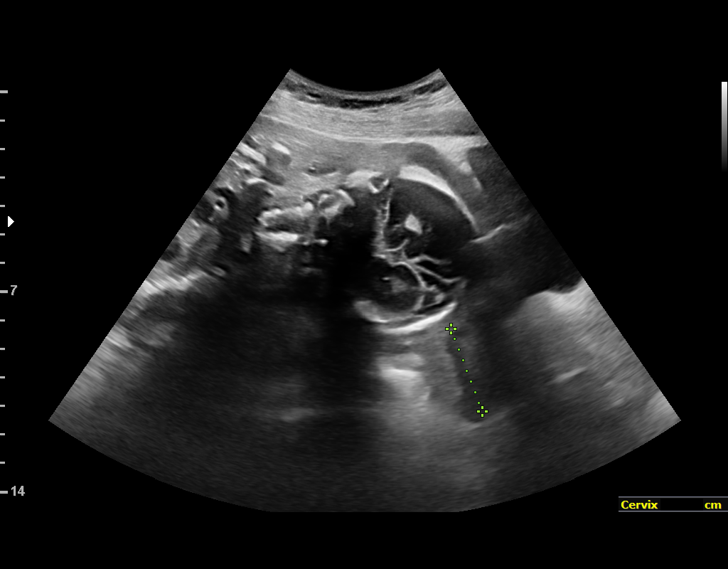
[im 51/63]
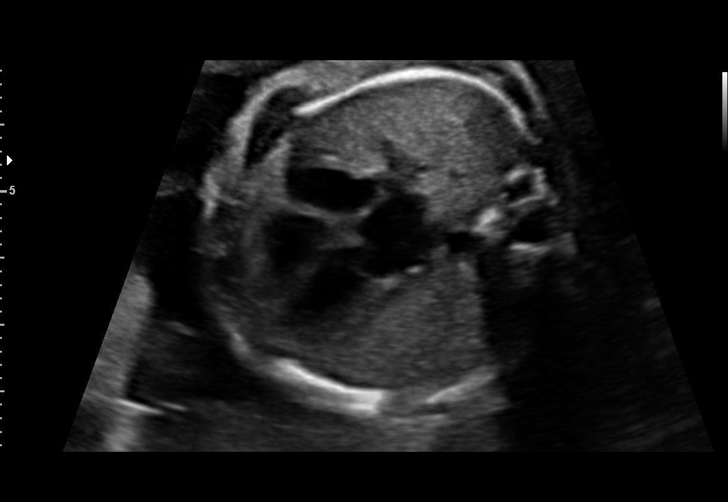
[im 56/63]
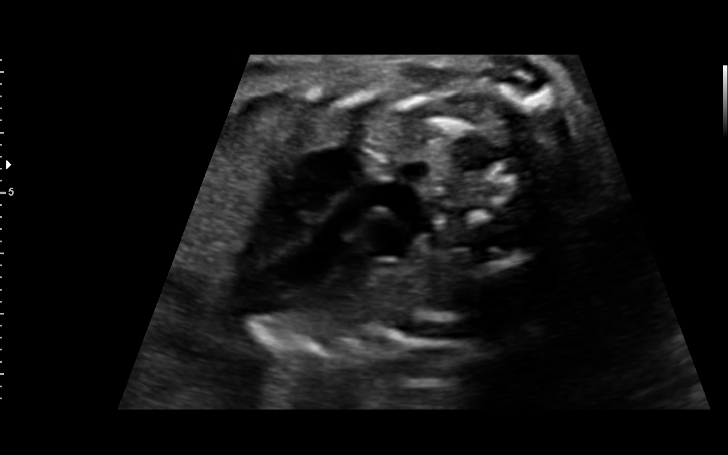
[im 60/63]
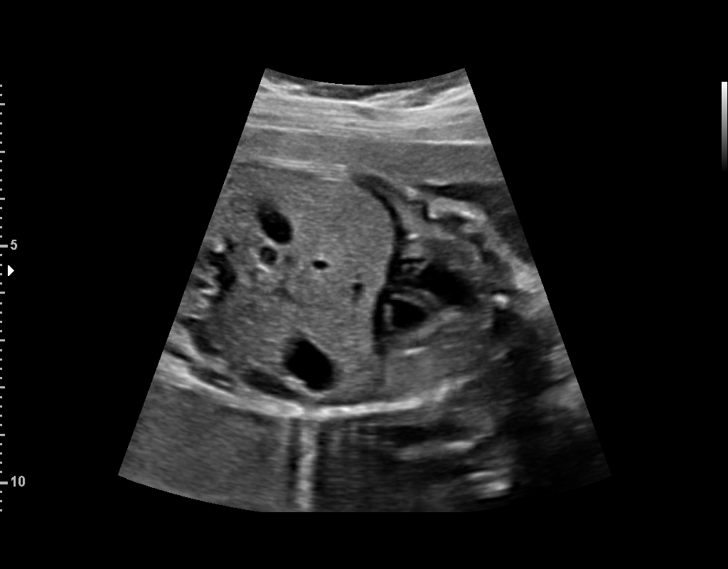

[13 of 28 positions shown; findings below may reference images not displayed]

OBGYN

1  YAHIR NICOLAS              [id number]      [id number]     [id number]
2  YAHIR NICOLAS              [id number]      [id number]     [id number]
Indications

26 weeks gestation of pregnancy
Small for gestational age fetus affecting
management of mother
Encounter for other antenatal screening
follow-up
OB History

Gravidity:    1         Term:   0        Prem:   0        SAB:   0
TOP:          0       Ectopic:  0        Living: 0
Fetal Evaluation

Num Of Fetuses:     1
Fetal Heart         140
Rate(bpm):
Cardiac Activity:   Observed
Presentation:       Cephalic
Placenta:           Posterior, above cervical os
P. Cord Insertion:  Visualized

Amniotic Fluid
AFI FV:      Subjectively within normal limits

Largest Pocket(cm)
4.2
Biometry

BPD:      60.1  mm     G. Age:  24w 4d          4  %    CI:        73.12   %    70 - 86
FL/HC:      20.6   %    18.6 -
HC:      223.4  mm     G. Age:  24w 2d        < 3  %    HC/AC:      1.15        1.04 -
AC:      193.9  mm     G. Age:  24w 1d        < 3  %    FL/BPD:     76.5   %    71 - 87
FL:         46  mm     G. Age:  25w 2d         15  %    FL/AC:      23.7   %    20 - 24
HUM:      42.6  mm     G. Age:  25w 4d         31  %

Est. FW:     711  gm      1 lb 9 oz     22  %
Gestational Age

LMP:           26w 1d        Date:  03/15/17                 EDD:   12/20/17
U/S Today:     24w 4d                                        EDD:   12/31/17
Best:          26w 1d     Det. By:  LMP  (03/15/17)          EDD:   12/20/17
Anatomy

Cranium:               Appears normal         Aortic Arch:            Previously seen
Cavum:                 Appears normal         Ductal Arch:            Previously seen
Ventricles:            Appears normal         Diaphragm:              Appears normal
Choroid Plexus:        Previously seen        Stomach:                Appears normal, left
sided
Cerebellum:            Previously seen        Abdomen:                Appears normal
Posterior Fossa:       Previously seen        Abdominal Wall:         Previously seen
Nuchal Fold:           Previously seen        Cord Vessels:           Previously seen
Face:                  Orbits and profile     Kidneys:                Appear normal
previously seen
Lips:                  Previously seen        Bladder:                Appears normal
Thoracic:              Appears normal         Spine:                  Previously seen
Heart:                 Appears normal         Upper Extremities:      Previously seen
(4CH, axis, and situs
RVOT:                  Previously seen        Lower Extremities:      Previously seen
LVOT:                  Appears normal

Other:  Fetus appears to be a female prev vis. Heels and 5th digit prev
visualized.
Doppler - Fetal Vessels

Umbilical Artery
S/D     %tile                                     PSV    ADFV    RDFV
(cm/s)
3.[REDACTED]      No      No

Cervix Uterus Adnexa

Cervix
Length:            3.1  cm.
Normal appearance by transabdominal scan.
Impression

Singleton intrauterine pregnancy at 26 weeks 1 day gestation
with fetal cardiac activity
Cephalic presentation
Posterior placenta
AC lag <3rd centile
UA Dopplers within normal limits
Normal appearing cervical length
Recommendations

Recommend follow-up ultrasound examination with weekly
BPP/UA Doppler and serial ultrasounds for growth

## 2019-03-11 IMAGING — US US MFM UA CORD DOPPLER
1 series · 12 of 28 positions shown · non-contrast
Comparison: none

[Series 1: us mfm ua cord doppler · 38 acquisitions, 12 frames shown]
[im 2/38]
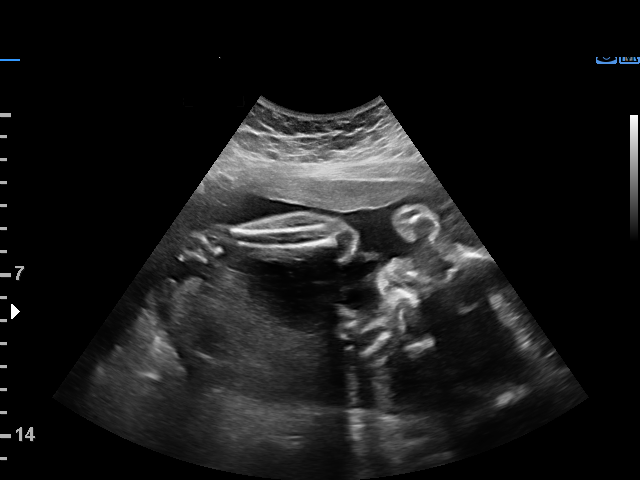
[im 5/38]
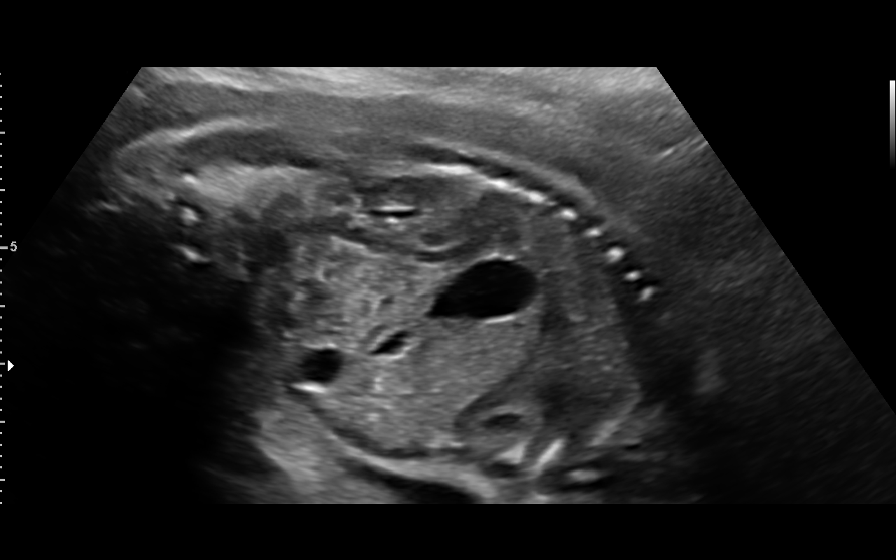
[im 7/38]
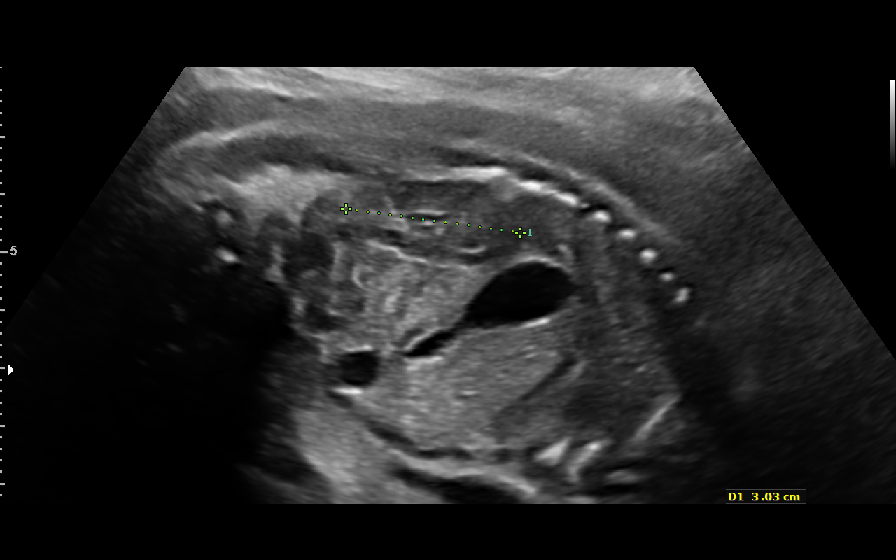
[im 11/38]
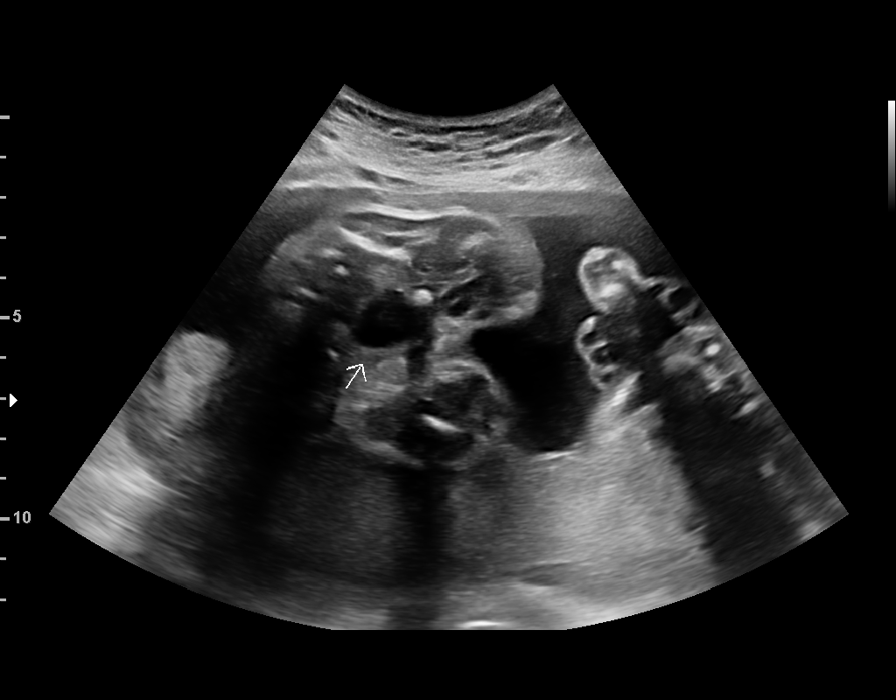
[im 14/38]
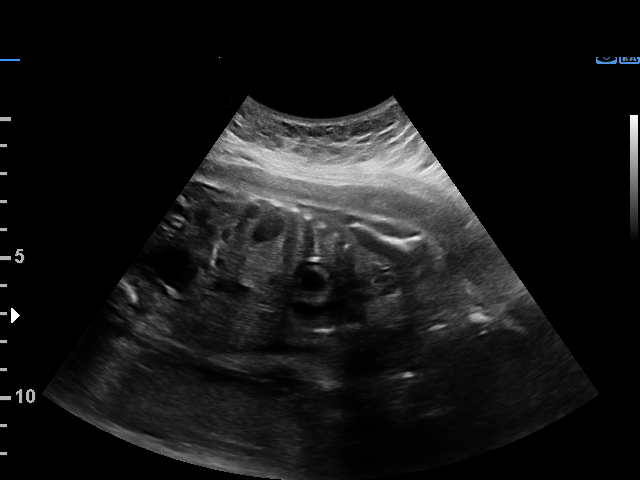
[im 17/38]
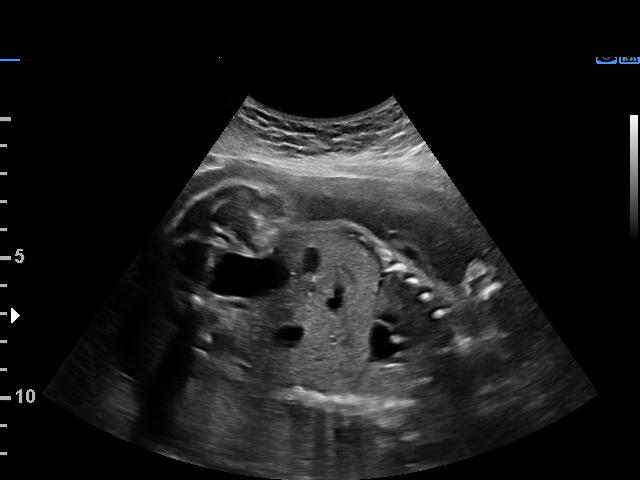
[im 21/38]
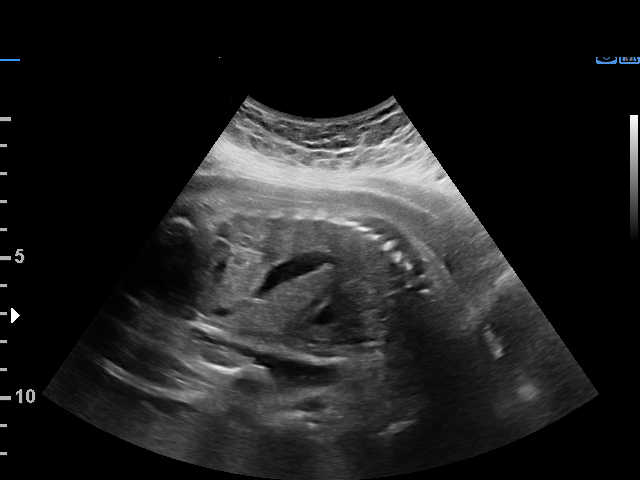
[im 24/38]
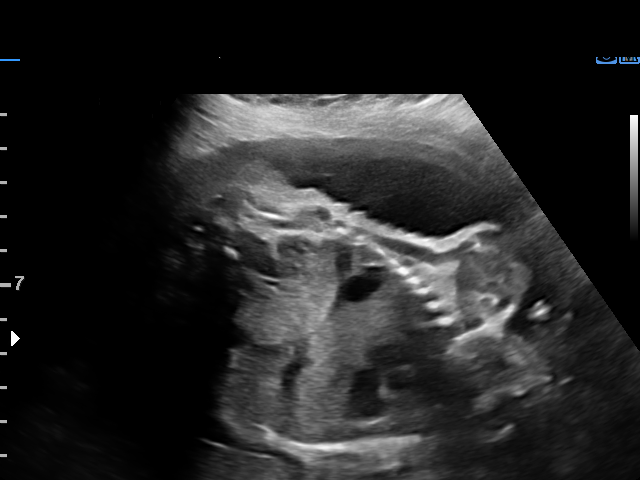
[im 27/38]
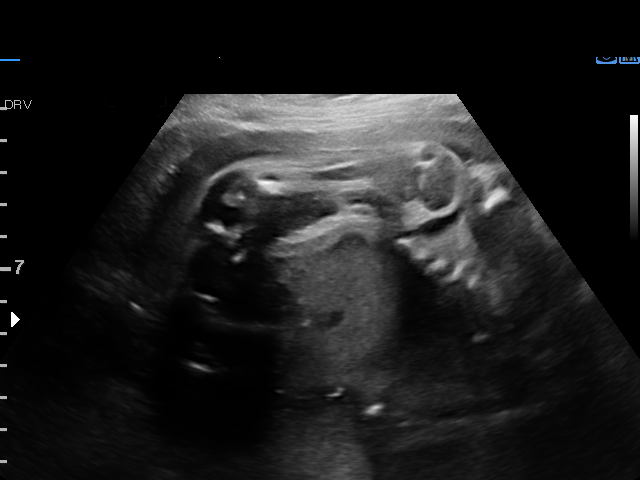
[im 31/38]
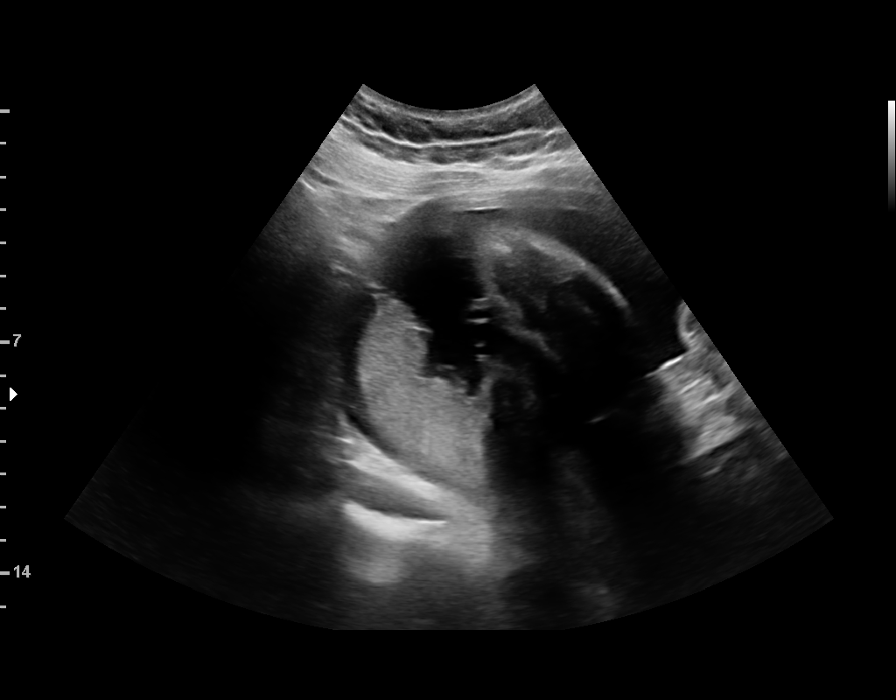
[im 33/38]
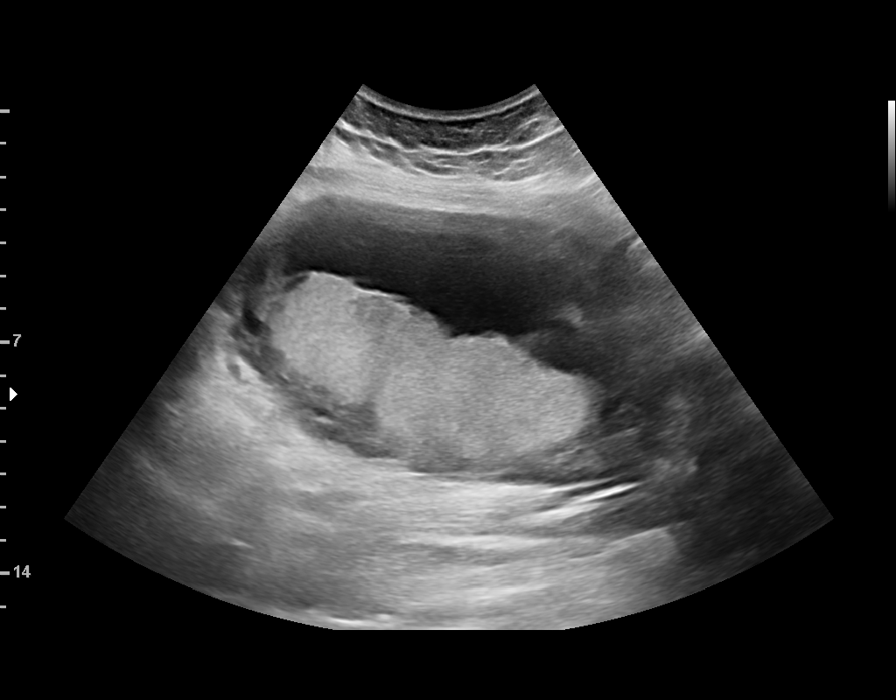
[im 36/38]
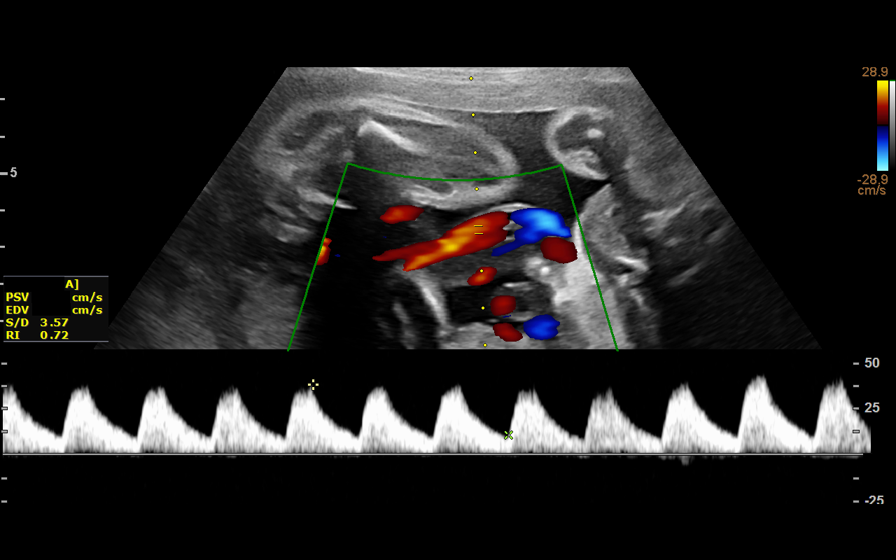

[12 of 28 positions shown; findings below may reference images not displayed]

OBGYN

1  MEDHINI PELKEY            [id number]      [id number]     [id number]
2  MEDHINI PELKEY            [id number]      [id number]     [id number]
Indications

28 weeks gestation of pregnancy
Small for gestational age fetus affecting
management of mother
OB History

Gravidity:    1         Term:   0        Prem:   0        SAB:   0
TOP:          0       Ectopic:  0        Living: 0
Fetal Evaluation

Num Of Fetuses:     1
Fetal Heart         140
Rate(bpm):
Cardiac Activity:   Observed
Presentation:       Cephalic
Placenta:           Posterior, above cervical os

Amniotic Fluid
AFI FV:      Subjectively within normal limits

Largest Pocket(cm)
5.13
Biophysical Evaluation
Amniotic F.V:   Within normal limits       F. Tone:        Observed
F. Movement:    Observed                   Score:          [DATE]
F. Breathing:   Not Observed
Gestational Age

LMP:           28w 1d        Date:  03/15/17                 EDD:   12/20/17
Best:          28w 1d     Det. By:  LMP  (03/15/17)          EDD:   12/20/17
Anatomy

Stomach:               Appears normal, left   Bladder:                Appears normal
sided
Kidneys:               Appear normal
Doppler - Fetal Vessels

Umbilical Artery
S/D     %tile                                     PSV    ADFV    RDFV
(cm/s)
3.[REDACTED]      No      No

Cervix Uterus Adnexa

Cervix
Length:           4.03  cm.
Normal appearance by transabdominal scan.
Impression

Single living intrauterine pregnancy at 28 weeks 1 day.
Normal amniotic fluid volume.
BPP [DATE] (-2 for lack of fetal breathing.
Normal umbilical artery Dopplers.
Recommendations

Recommend follow-up ultrasound examination with weekly
BPP/UA Doppler and serial ultrasounds for growth

## 2019-03-26 IMAGING — US US MFM FETAL BPP W/O NON-STRESS
1 series · 14 of 28 positions shown · non-contrast
Comparison: none

[Series 1: us mfm fetal bpp w/o non-stress · 45 acquisitions, 14 frames shown]
[im 2/45]
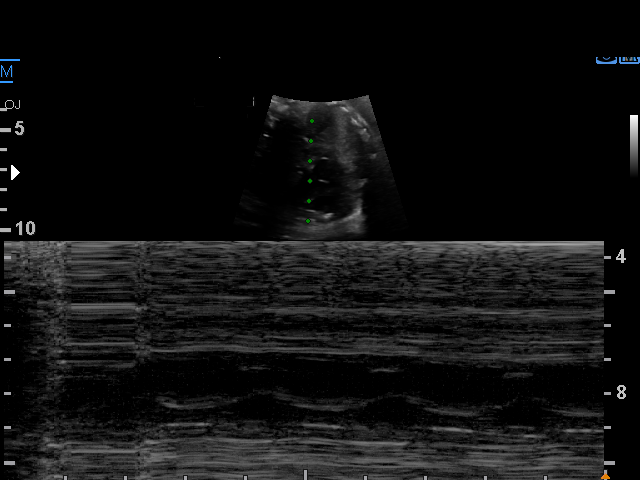
[im 5/45]
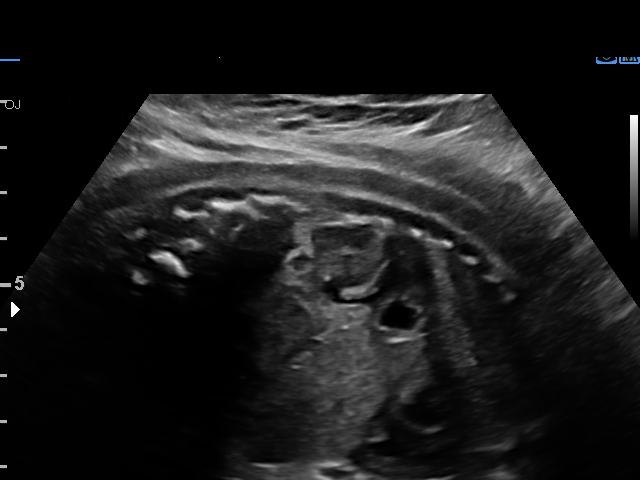
[im 9/45]
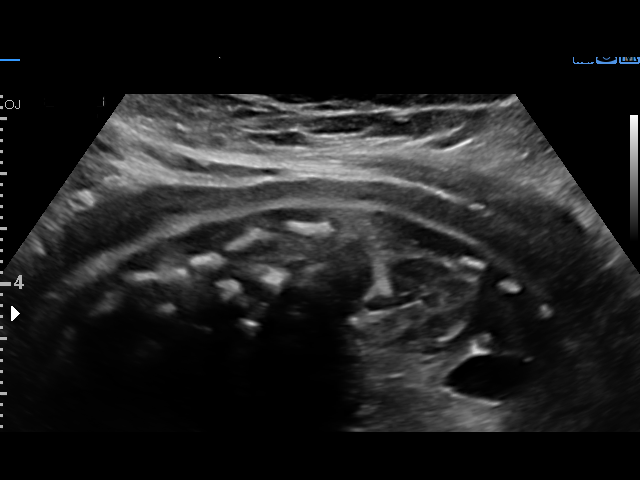
[im 12/45]
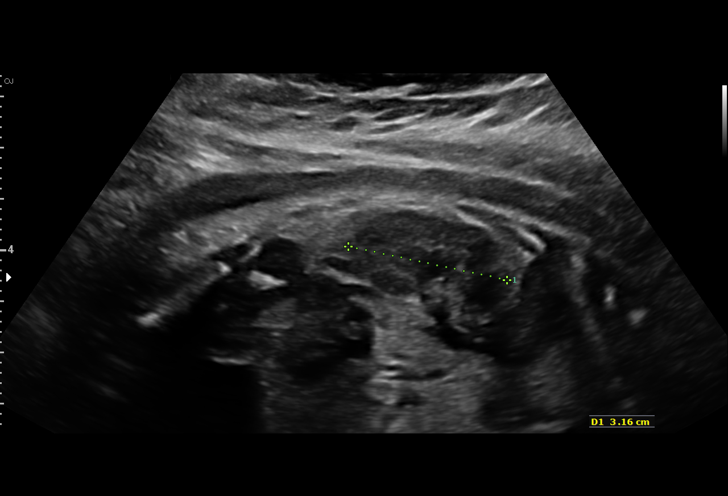
[im 15/45]
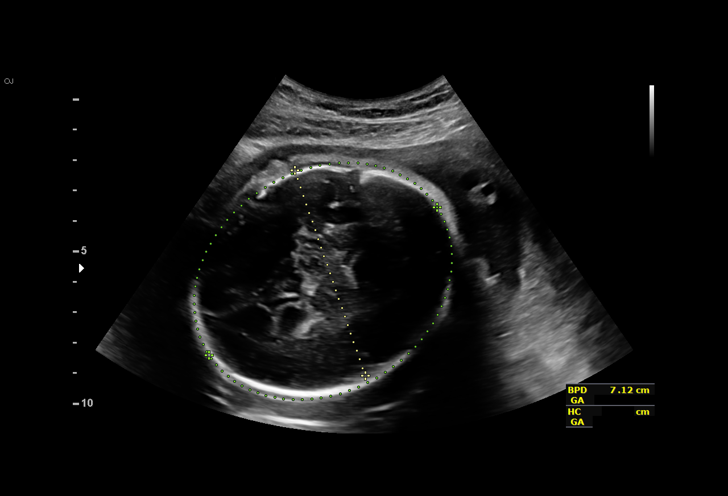
[im 18/45]
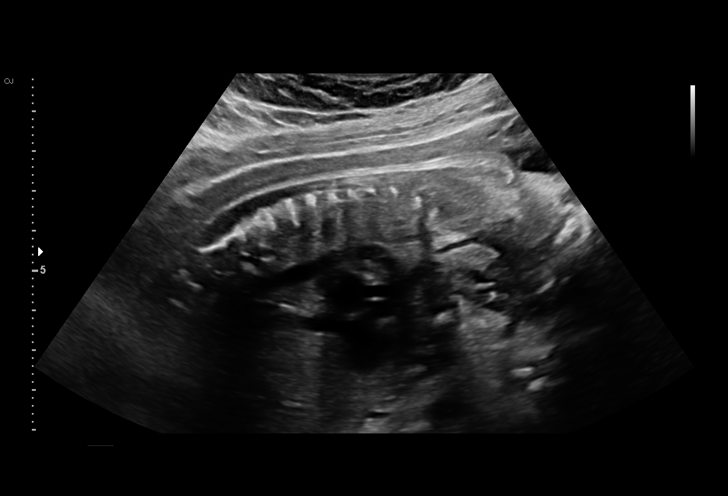
[im 22/45]
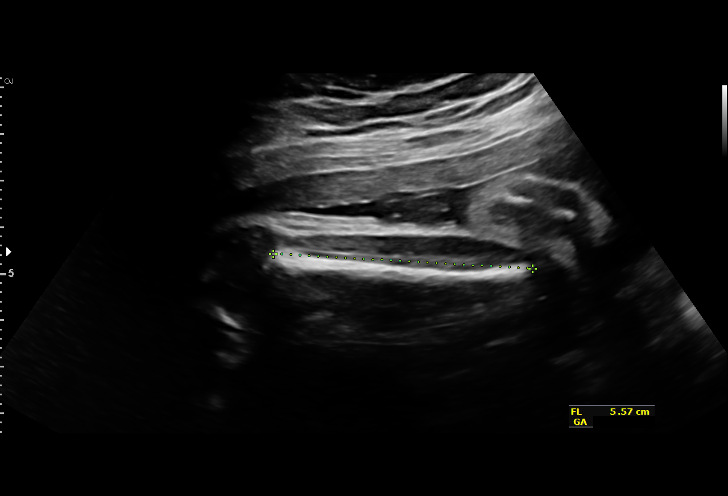
[im 25/45]
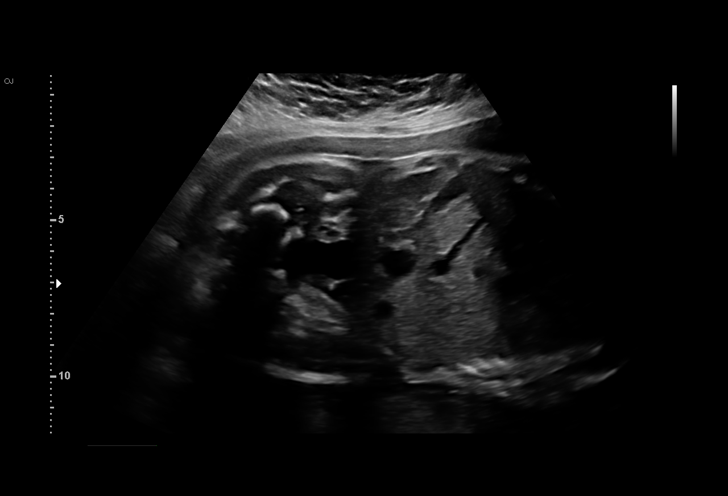
[im 28/45]
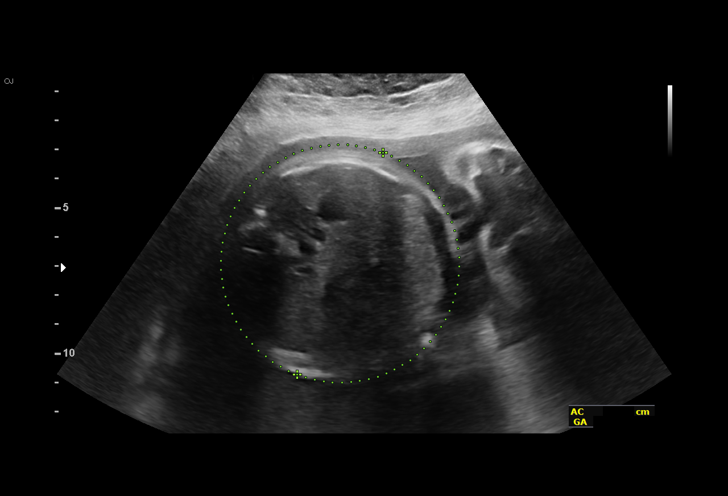
[im 31/45]
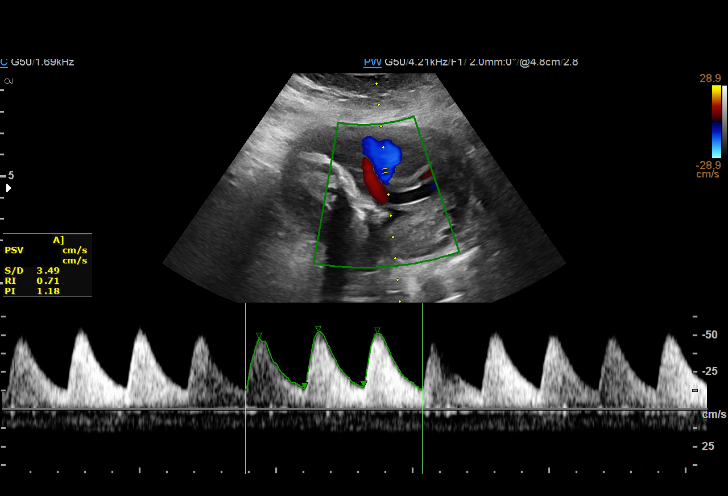
[im 35/45]
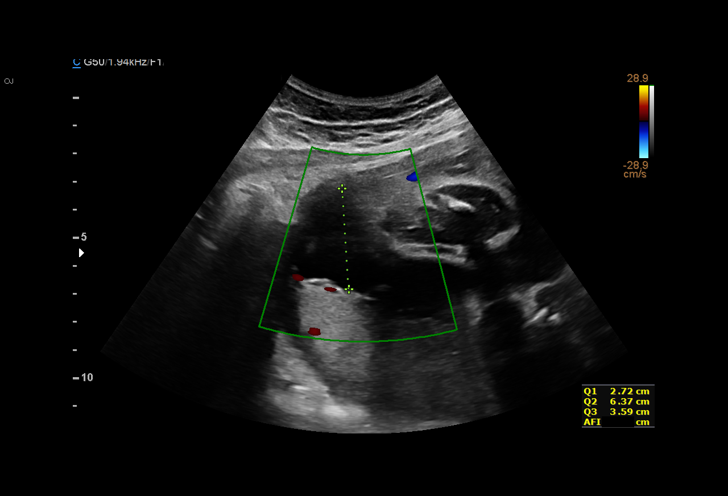
[im 38/45]
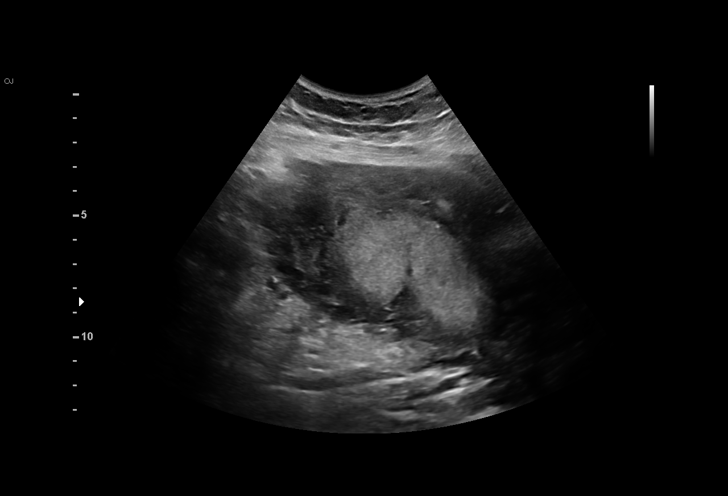
[im 41/45]
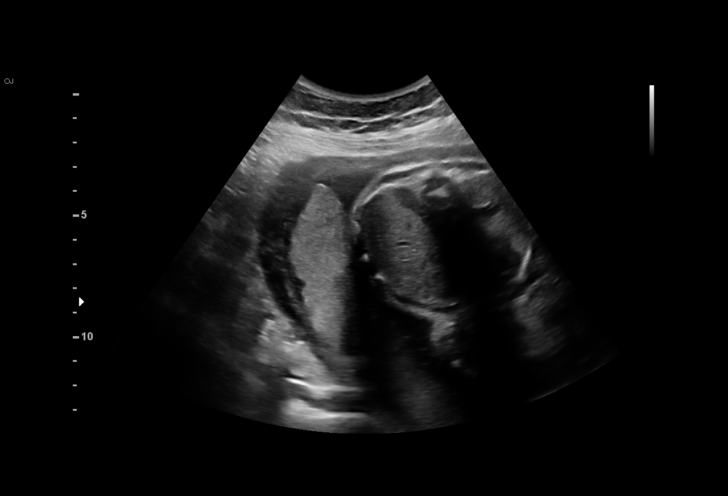
[im 45/45]
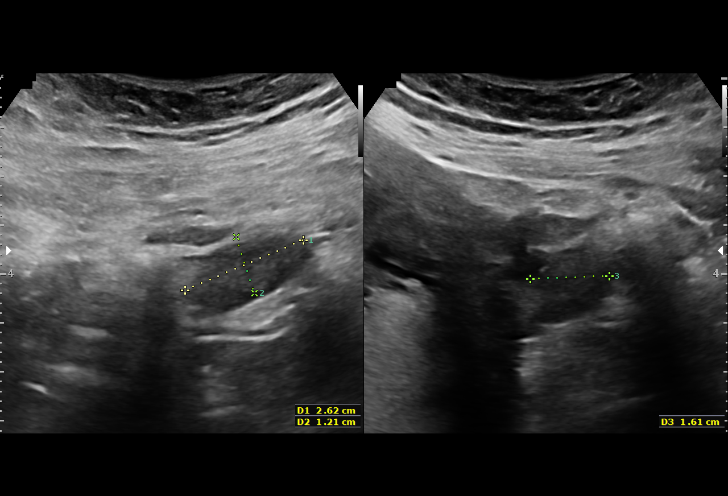

[14 of 28 positions shown; findings below may reference images not displayed]

OBGYN

1  NANIEK MAGAR            [id number]      [id number]     [id number]
2  NANIEK MAGAR            [id number]      [id number]     [id number]
Indications

30 weeks gestation of pregnancy
Maternal care for known or suspected poor
fetal growth, third trimester, not applicable or
unspecified
OB History

Gravidity:    1         Term:   0        Prem:   0        SAB:   0
TOP:          0       Ectopic:  0        Living: 0
Fetal Evaluation

Num Of Fetuses:     1
Fetal Heart         141
Rate(bpm):
Cardiac Activity:   Observed
Presentation:       Cephalic
Placenta:           Posterior, above cervical os
P. Cord Insertion:  Previously Visualized

Amniotic Fluid
AFI FV:      Subjectively within normal limits

AFI Sum(cm)     %Tile       Largest Pocket(cm)
12.68           35

RUQ(cm)                     LUQ(cm)        LLQ(cm)
2.72
Biophysical Evaluation

Amniotic F.V:   Within normal limits       F. Tone:        Observed
F. Movement:    Observed                   Score:          [DATE]
F. Breathing:   Observed
Biometry

BPD:      71.9  mm     G. Age:  28w 6d          7  %    CI:        79.15   %    70 - 86
FL/HC:      22.0   %    19.2 -
HC:      255.5  mm     G. Age:  27w 5d        < 3  %    HC/AC:      1.01        0.99 -
AC:      254.2  mm     G. Age:  29w 4d         26  %    FL/BPD:     78.3   %    71 - 87
FL:       56.3  mm     G. Age:  29w 4d         19  %    FL/AC:      22.1   %    20 - 24
HUM:      49.8  mm     G. Age:  29w 2d         30  %

Est. FW:    5725  gm      3 lb 1 oz     34  %
Gestational Age

LMP:           30w 2d        Date:  03/15/17                 EDD:   12/20/17
U/S Today:     29w 0d                                        EDD:   12/29/17
Best:          30w 2d     Det. By:  LMP  (03/15/17)          EDD:   12/20/17
Anatomy

Cranium:               Appears normal         Aortic Arch:            Previously seen
Cavum:                 Appears normal         Ductal Arch:            Previously seen
Ventricles:            Appears normal         Diaphragm:              Appears normal
Choroid Plexus:        Previously seen        Stomach:                Appears normal, left
sided
Cerebellum:            Previously seen        Abdomen:                Appears normal
Posterior Fossa:       Previously seen        Abdominal Wall:         Previously seen
Nuchal Fold:           Previously seen        Cord Vessels:           Appears normal (3
vessel cord)
Face:                  Orbits and profile     Kidneys:                Appear normal
previously seen
Lips:                  Previously seen        Bladder:                Appears normal
Thoracic:              Appears normal         Spine:                  Previously seen
Heart:                 Previously seen        Upper Extremities:      Previously seen
RVOT:                  Previously seen        Lower Extremities:      Previously seen
LVOT:                  Previously seen

Other:  Fetus appears to be a female prev vis. Heels and 5th digit prev
visualized.
Doppler - Fetal Vessels

Umbilical Artery
S/D     %tile     RI              PI              PSV    ADFV    RDFV
(cm/s)
3.34       73     0.7            1.13             51.86      No      No

Impression

SIUP at 30+2 weeks
Normal interval anatomy; anatomic survey complete
Normal amniotic fluid volume
Appropriate interval growth with EFW at the 34th %tile; AC at
the 26th %tile - better!
BPP [DATE]
Recommendations

Follow-up ultrasound for growth in 4 weeks
